# Patient Record
Sex: Female | Born: 2005
Health system: Southern US, Community
[De-identification: ages and names within clinical notes are randomized; demographics above are authoritative.]

## PROBLEM LIST (undated history)

## (undated) DIAGNOSIS — E282 Polycystic ovarian syndrome: Secondary | ICD-10-CM

## (undated) HISTORY — DX: Polycystic ovarian syndrome: E28.2

---
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
## 2023-03-02 ENCOUNTER — Emergency Department (HOSPITAL_COMMUNITY)
Admission: EM | Admit: 2023-03-02 | Discharge: 2023-03-02 | Disposition: A | Discharge status: Home / Self Care | Payer: Medicaid Other | Attending: Emergency Medicine | Admitting: Emergency Medicine

## 2023-03-02 ENCOUNTER — Encounter (HOSPITAL_COMMUNITY): Payer: Self-pay

## 2023-03-02 ENCOUNTER — Other Ambulatory Visit: Payer: Self-pay

## 2023-03-02 DIAGNOSIS — R42 Dizziness and giddiness: Secondary | ICD-10-CM | POA: Diagnosis not present

## 2023-03-02 DIAGNOSIS — E876 Hypokalemia: Secondary | ICD-10-CM

## 2023-03-02 DIAGNOSIS — R55 Syncope and collapse: Secondary | ICD-10-CM | POA: Diagnosis not present

## 2023-03-02 DIAGNOSIS — R000 Tachycardia, unspecified: Secondary | ICD-10-CM | POA: Diagnosis not present

## 2023-03-02 DIAGNOSIS — W01198A Fall on same level from slipping, tripping and stumbling with subsequent striking against other object, initial encounter: Secondary | ICD-10-CM | POA: Insufficient documentation

## 2023-03-02 DIAGNOSIS — W19XXXA Unspecified fall, initial encounter: Secondary | ICD-10-CM | POA: Diagnosis not present

## 2023-03-02 LAB — CBC
HCT: 37 % (ref 36.0–46.0)
Hemoglobin: 12.1 g/dL (ref 12.0–15.0)
MCH: 30.3 pg (ref 26.0–34.0)
MCHC: 32.7 g/dL (ref 30.0–36.0)
MCV: 92.7 fL (ref 80.0–100.0)
Platelets: 197 10*3/uL (ref 150–400)
RBC: 3.99 MIL/uL (ref 3.87–5.11)
RDW: 11.9 % (ref 11.5–15.5)
WBC: 8.2 10*3/uL (ref 4.0–10.5)
nRBC: 0 % (ref 0.0–0.2)

## 2023-03-02 LAB — BASIC METABOLIC PANEL
Anion gap: 7 (ref 5–15)
BUN: 9 mg/dL (ref 6–20)
CO2: 24 mmol/L (ref 22–32)
Calcium: 9.4 mg/dL (ref 8.9–10.3)
Chloride: 102 mmol/L (ref 98–111)
Creatinine, Ser: 0.95 mg/dL (ref 0.44–1.00)
GFR, Estimated: >60 mL/min (ref >60)
Glucose, Bld: 75 mg/dL (ref 70–99)
Potassium: 3.1 mmol/L — ABNORMAL LOW (ref 3.5–5.1)
Sodium: 133 mmol/L — ABNORMAL LOW (ref 135–145)

## 2023-03-02 LAB — URINALYSIS, ROUTINE W REFLEX MICROSCOPIC
Bilirubin Urine: NEGATIVE
Glucose, UA: NEGATIVE mg/dL
Hgb urine dipstick: NEGATIVE
Ketones, ur: 5 mg/dL — AB
Leukocytes,Ua: NEGATIVE
Nitrite: NEGATIVE
Protein, ur: 30 mg/dL — AB
Specific Gravity, Urine: 1.009 (ref 1.005–1.030)
pH: 7 (ref 5.0–8.0)

## 2023-03-02 LAB — CBG MONITORING, ED: Glucose-Capillary: 110 mg/dL — ABNORMAL HIGH (ref 70–99)

## 2023-03-02 LAB — POC URINE PREG, ED: Preg Test, Ur: NEGATIVE

## 2023-03-02 MED ORDER — POTASSIUM CHLORIDE ER 10 MEQ PO TBCR: 10.0000 meq | EXTENDED_RELEASE_TABLET | Freq: Every day | ORAL | 0 refills | Status: DC

## 2023-05-26 DIAGNOSIS — N83201 Unspecified ovarian cyst, right side: Secondary | ICD-10-CM | POA: Diagnosis not present

## 2023-05-26 DIAGNOSIS — O039 Complete or unspecified spontaneous abortion without complication: Secondary | ICD-10-CM | POA: Diagnosis not present

## 2023-05-31 ENCOUNTER — Encounter: Payer: Self-pay | Admitting: Obstetrics & Gynecology

## 2023-05-31 ENCOUNTER — Ambulatory Visit (INDEPENDENT_AMBULATORY_CARE_PROVIDER_SITE_OTHER): Payer: Self-pay | Admitting: Obstetrics & Gynecology

## 2023-05-31 VITALS — BP 105/69 | HR 88 | Ht 64.0 in | Wt 113.6 lb

## 2023-05-31 DIAGNOSIS — Z3A00 Weeks of gestation of pregnancy not specified: Secondary | ICD-10-CM

## 2023-05-31 DIAGNOSIS — Z30013 Encounter for initial prescription of injectable contraceptive: Secondary | ICD-10-CM

## 2023-05-31 DIAGNOSIS — O039 Complete or unspecified spontaneous abortion without complication: Secondary | ICD-10-CM

## 2023-05-31 MED ORDER — MEDROXYPROGESTERONE ACETATE 150 MG/ML IM SUSP: 150.0000 mg | INTRAMUSCULAR | 4 refills | Status: DC

## 2023-05-31 MED ORDER — MEDROXYPROGESTERONE ACETATE 150 MG/ML IM SUSY
150.0000 mg | PREFILLED_SYRINGE | Freq: Once | INTRAMUSCULAR | Status: AC
  Administered 2023-05-31: 150 mg via INTRAMUSCULAR

## 2023-05-31 NOTE — Progress Notes (Signed)
   GYN VISIT Patient name: Kelsey Marsh MRN [id number]  Date of birth: [birth year] Chief Complaint:   Follow-up (Seen at [facility] ER for SAB)  History of Present Illness:   Kelsey Marsh is a 18 y.o. G54P0010 female being seen today for SAB follow up..     Se[facility] ED [facility] due to vaginal bleeding HCG: 2938 US showed thickened endometrium and normal ovaries.   Pt diagnosed with recent miscarriage and advised outpatient follow up.  Today she reports that she is still bleeding, but improved since prior ER visit.  Using about 4 pads per day with passage of only small clots.  Denies significant dysmenorrhea or pelvic pain  Patient was on OCPs and taking regularly.  She reports that the condom broke tried Plan B and still this pregnancy/miscarriage occurred.  She wishes to review contraceptive options  Last seen by Roseanne Reno 05/29/2022 at that time she was on OCPS.  No LMP recorded (lmp unknown).    Review of Systems:   Pertinent items are noted in HPI Denies fever/chills, dizziness, headaches, visual disturbances, fatigue, shortness of breath, chest pain, abdominal pain, vomiting, no problems with periods, bowel movements, urination, or intercourse unless otherwise stated above.  Pertinent History Reviewed:  History reviewed. No pertinent surgical history.  History reviewed. No pertinent past medical history. Reviewed problem list, medications and allergies. Physical Assessment:   Vitals:   05/31/23 1546  BP: 105/69  Pulse: 88  Weight: 113 lb 9.6 oz (51.5 kg)  Height: 5\' 4"  (1.626 m)  Body mass index is 19.5 kg/m.       Physical Examination:   General appearance: alert, well appearing, and in no distress  Psych: mood appropriate, normal affect  Skin: warm & dry   Cardiovascular: normal heart rate noted  Respiratory: normal respiratory effort, no distress  Abdomen: soft, non-tender, no rebound no guarding  Pelvic: examination not indicated  Extremities: no  edema   Chaperone: N/A    Assessment & Plan:  1) SAB -Reassured patient that bleeding may continue and suspect resolution over the next 1 to 2 weeks -Plan to follow HCG level  2) Contraceptive management -Reviewed all contraceptive options -Plan to transition to Depo   Orders Placed This Encounter  Procedures   Beta hCG quant (ref lab)    Return in about 3 months (around 08/29/2023) for Depot shot every 3 mos (RN visit), please send to lab.   Myna Hidalgo, DO Attending Obstetrician & Gynecologist, [facility] for [employer], [facility]

## 2023-06-01 ENCOUNTER — Encounter: Payer: Self-pay | Admitting: Obstetrics & Gynecology

## 2023-06-05 ENCOUNTER — Encounter: Payer: BC Managed Care – PPO | Admitting: Adult Health

## 2023-07-10 DIAGNOSIS — Z23 Encounter for immunization: Secondary | ICD-10-CM | POA: Diagnosis not present

## 2023-08-23 ENCOUNTER — Encounter: Payer: Self-pay | Admitting: Women's Health

## 2023-08-23 ENCOUNTER — Ambulatory Visit: Admitting: Women's Health

## 2023-08-23 ENCOUNTER — Ambulatory Visit: Payer: Self-pay

## 2023-08-23 VITALS — BP 110/74 | HR 81 | Ht 64.0 in | Wt 117.0 lb

## 2023-08-23 DIAGNOSIS — Z3009 Encounter for other general counseling and advice on contraception: Secondary | ICD-10-CM

## 2023-08-23 DIAGNOSIS — Z113 Encounter for screening for infections with a predominantly sexual mode of transmission: Secondary | ICD-10-CM

## 2023-08-23 MED ORDER — MISOPROSTOL 200 MCG PO TABS: 400.0000 ug | ORAL_TABLET | Freq: Once | ORAL | 0 refills | Status: DC

## 2023-08-23 NOTE — Patient Instructions (Signed)
 NO SEX UNTIL AFTER YOU GET YOUR BIRTH CONTROL  Take a pregnancy test the morning of your visit, if negative, take the 2 pills at least 2 hours before your appointment If it is positive, DO NOT take the medicine, and call us

## 2023-08-23 NOTE — Progress Notes (Signed)
   GYN VISIT Patient name: Kelsey Marsh MRN [id number]  Date of birth: 2005/09/30 Chief Complaint:   borth consult consult (Last depo  jan 2025,next shot due today,talk about iud)  History of Present Illness:   Kelsey Marsh is a 18 y.o. G50P0010 female being seen today to discuss getting IUD. Got 1 shot of depo 1/16-due for next shot today, has noticed increased acne, weight gain. Wants IUD. Interested in Paragard, however has irregular periods (sometimes q few months) when not on birth control w/ h/o heavy periods. Discussed other options, wants [name]. Last sex last night. Has had on & off bleeding w/ depo, last bled 4/7 for couple of days.   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   Patient's last menstrual period was 08/20/2023. The current method of family planning is Depo-Provera injections.  Last pap <21yo. Results were: N/A      No data to display               No data to display           Review of Systems:   Pertinent items are noted in HPI Denies fever/chills, dizziness, headaches, visual disturbances, fatigue, shortness of breath, chest pain, abdominal pain, vomiting, abnormal vaginal discharge/itching/odor/irritation, problems with periods, bowel movements, urination, or intercourse unless otherwise stated above.  Pertinent History Reviewed:  Reviewed past medical,surgical, social, obstetrical and family history.  Reviewed problem list, medications and allergies. Physical Assessment:   Vitals:   08/23/23 1051  BP: 110/74  Pulse: 81  Weight: 117 lb (53.1 kg)  Height: 5\' 4"  (1.626 m)  Body mass index is 20.08 kg/m.       Physical Examination:   General appearance: alert, well appearing, and in no distress  Mental status: alert, oriented to person, place, and time  Skin: warm & dry   Cardiovascular: normal heart rate noted  Respiratory: normal respiratory effort, no distress  Abdomen: soft, non-tender   Pelvic: examination not indicated  Extremities:  no edema   Chaperone: N/A  No results found for this or any previous visit (from the past 24 hours).  Assessment & Plan:  1) Contraception counseling> 1 dose of depo 1/16, due for next today, but wants [name] IUD (nullip, sex last night). Abstinence until after IUD. Has clinicals until 4/28. Rx cytotec, take 2hr before IUD appt (as long as pregnancy test neg that am).   2) STD screen> gc/ct today  Meds:  Meds ordered this encounter  Medications   misoprostol (CYTOTEC) 200 MCG tablet    Sig: Take 2 tablets (400 mcg total) by mouth once for 1 dose. At least 2 hours before your IUD appointment    Dispense:  2 tablet    Refill:  0    Orders Placed This Encounter  Procedures   GC/Chlamydia Probe Amp    Return for 4/28 IUD insertion.  Cheral Marker CNM, [facility] 08/23/2023 11:34 AM

## 2023-08-24 DIAGNOSIS — Z113 Encounter for screening for infections with a predominantly sexual mode of transmission: Secondary | ICD-10-CM | POA: Diagnosis not present

## 2023-08-28 LAB — GC/CHLAMYDIA PROBE AMP
Chlamydia trachomatis, NAA: NEGATIVE
Neisseria Gonorrhoeae by PCR: NEGATIVE

## 2023-09-10 ENCOUNTER — Encounter: Payer: Self-pay | Admitting: Women's Health

## 2023-09-10 ENCOUNTER — Ambulatory Visit: Admitting: Women's Health

## 2023-09-10 VITALS — BP 110/71 | HR 73 | Wt 115.3 lb

## 2023-09-10 DIAGNOSIS — Z30430 Encounter for insertion of intrauterine contraceptive device: Secondary | ICD-10-CM | POA: Diagnosis not present

## 2023-09-10 DIAGNOSIS — Z3202 Encounter for pregnancy test, result negative: Secondary | ICD-10-CM | POA: Diagnosis not present

## 2023-09-10 LAB — POCT URINE PREGNANCY: Preg Test, Ur: NEGATIVE

## 2023-09-10 MED ORDER — LEVONORGESTREL 19.5 MG IU IUD: INTRAUTERINE_SYSTEM | Freq: Once | INTRAUTERINE | Status: AC

## 2023-09-10 NOTE — Progress Notes (Signed)
   IUD INSERTION Patient name: Kelsey Marsh MRN [id number]  Date of birth: 20-Mar-2006 Subjective Findings:   Mistica Deichmann Vanmetre is a 18 y.o. G74P0010 female being seen today for insertion of a [name] IUD.  Patient's last menstrual period was 09/06/2023. Last sexual intercourse was 1.5wks ago, but on period now Last pap<21yo. Results were: N/A  The risks and benefits of the method and placement have been thouroughly reviewed with the patient and all questions were answered.  Specifically the patient is aware of failure rate of 05/998, expulsion of the IUD and of possible perforation.  The patient is aware of irregular bleeding due to the method and understands the incidence of irregular bleeding diminishes with time.  Signed copy of informed consent in chart.       No data to display               No data to display           Pertinent History Reviewed:   Reviewed past medical,surgical, social, obstetrical and family history.  Reviewed problem list, medications and allergies. Objective Findings & Procedure:   Vitals:   09/10/23 1438  BP: 110/71  Pulse: 73  Weight: 115 lb 4.8 oz (52.3 kg)  Body mass index is 19.79 kg/m.  Results for orders placed or performed in visit on 09/10/23 (from the past 24 hours)  POCT urine pregnancy   Collection Time: 09/10/23  2:49 PM  Result Value Ref Range   Preg Test, Ur Negative Negative     Time out was performed.  A graves speculum was placed in the vagina.  The cervix was visualized, prepped using Betadine, and grasped with a single tooth tenaculum. The uterus was found to be neutral and it sounded to 7 cm.  [name]  IUD placed per manufacturer's recommendations. The strings were trimmed to approximately 3 cm. The patient tolerated the procedure well.   Informal transabdominal sonogram was performed and the proper placement of the IUD was verified.  Chaperone: Archivist & Plan:   1) [name] IUD insertion The  patient was given post procedure instructions, including signs and symptoms of infection and to check for the strings after each menses or each month, and refraining from intercourse or anything in the vagina for 3 days. She was given a care card with date IUD placed, and date IUD to be removed. She is scheduled for a f/u appointment in 4 weeks.  Orders Placed This Encounter  Procedures   POCT urine pregnancy    Return in about 4 weeks (around 10/08/2023) for IUD f/u, CNM, in person.  Ferd Householder CNM, [facility] 09/10/2023 3:11 PM

## 2023-09-10 NOTE — Addendum Note (Signed)
 Addended by: Laurinda Porch A on: 09/10/2023 04:00 PM   Modules accepted: Orders

## 2023-09-10 NOTE — Patient Instructions (Signed)

## 2023-10-09 ENCOUNTER — Ambulatory Visit (INDEPENDENT_AMBULATORY_CARE_PROVIDER_SITE_OTHER): Admitting: Adult Health

## 2023-10-09 ENCOUNTER — Encounter: Payer: Self-pay | Admitting: Adult Health

## 2023-10-09 VITALS — BP 106/69 | HR 71 | Ht 64.0 in | Wt 121.5 lb

## 2023-10-09 DIAGNOSIS — Z30431 Encounter for routine checking of intrauterine contraceptive device: Secondary | ICD-10-CM | POA: Diagnosis not present

## 2023-10-09 NOTE — Progress Notes (Signed)
  Subjective:     Patient ID: Kelsey Marsh, female   DOB: [birth year], 18 y.o.   MRN: [id number]  HPI Kelsey Marsh is a 18 year old female, single, G1P0010, in for IUD check, had kyleena  placed 09/10/23.    Review of Systems For IUD check Denies any bleeding or pain Reviewed past medical,surgical, social and family history. Reviewed medications and allergies.     Objective:   Physical Exam BP 106/69 (BP Location: Left Arm, Patient Position: Sitting, Cuff Size: Normal)   Pulse 71   Ht 5\' 4"  (1.626 m)   Wt 121 lb 8 oz (55.1 kg)   LMP 10/04/2023 (Approximate)   BMI 20.86 kg/m     Skin warm and dry.Pelvic: external genitalia is normal in appearance no lesions, vagina: pink,urethra has no lesions or masses noted, cervix:smooth, strings are short, but seen at os, uterus: normal size, shape and contour, non tender, no masses felt, adnexa: no masses or tenderness noted. Bladder is non tender and no masses felt.  Fall risk is low  Upstream - 10/09/23 1059       Pregnancy Intention Screening   Does the patient want to become pregnant in the next year? No    Does the patient's partner want to become pregnant in the next year? No    Would the patient like to discuss contraceptive options today? No      Contraception Wrap Up   Current Method IUD or IUS    End Method IUD or IUS    Contraception Counseling Provided Yes            Examination chaperoned by Alphonso Aschoff LPN  Assessment:     1. IUD check up (Primary) [name]  placed 09/10/23 Strings short, but seen at os    Plan:     Follow up prn Pap at 19

## 2024-02-13 ENCOUNTER — Encounter (INDEPENDENT_AMBULATORY_CARE_PROVIDER_SITE_OTHER): Payer: Self-pay

## 2024-02-14 ENCOUNTER — Encounter (INDEPENDENT_AMBULATORY_CARE_PROVIDER_SITE_OTHER): Payer: Self-pay

## 2024-03-25 ENCOUNTER — Ambulatory Visit (INDEPENDENT_AMBULATORY_CARE_PROVIDER_SITE_OTHER): Admitting: Adult Health

## 2024-03-25 ENCOUNTER — Encounter: Payer: Self-pay | Admitting: Adult Health

## 2024-03-25 ENCOUNTER — Other Ambulatory Visit (HOSPITAL_COMMUNITY)
Admission: RE | Admit: 2024-03-25 | Discharge: 2024-03-25 | Disposition: A | Discharge status: Home / Self Care | Source: Ambulatory Visit | Attending: Adult Health | Admitting: Adult Health

## 2024-03-25 VITALS — BP 117/77 | HR 76 | Ht 64.0 in | Wt 118.0 lb

## 2024-03-25 DIAGNOSIS — R1031 Right lower quadrant pain: Secondary | ICD-10-CM | POA: Diagnosis not present

## 2024-03-25 DIAGNOSIS — Z975 Presence of (intrauterine) contraceptive device: Secondary | ICD-10-CM

## 2024-03-25 DIAGNOSIS — Z113 Encounter for screening for infections with a predominantly sexual mode of transmission: Secondary | ICD-10-CM | POA: Diagnosis not present

## 2024-03-25 NOTE — Progress Notes (Signed)
  Subjective:     Patient ID: Kelsey Marsh, female   DOB: 2005-07-07, 18 y.o.   MRN: [id number]  HPI Kelsey Marsh is a 18 year old female, single, G1P0010, in complaining of RLQ pain since fist of this year, had sharp pain today and some nausea. She has IUD. She works for [employer], as CMA.   Review of Systems +RLQ pain since fist of this year, had sharp pain today and some nausea.  Reviewed past medical,surgical, social and family history. Reviewed medications and allergies.      Objective:   Physical Exam BP 117/77 (BP Location: Right Arm, Patient Position: Sitting, Cuff Size: Normal)   Pulse 76   Ht 5' 4 (1.626 m)   Wt 118 lb (53.5 kg)   BMI 20.25 kg/m     Skin warm and dry.Pelvic: external genitalia is normal in appearance no lesions, vagina: white discharge without odor,urethra has no lesions or masses noted, cervix:smooth, +short IUD strings at os, uterus: normal size, shape and contour, mildly tender, no masses felt, adnexa: no masses or tenderness noted. Bladder is non tender and no masses felt. CV swab obtained  Fall risk is low  Upstream - 03/25/24 1513       Pregnancy Intention Screening   Does the patient want to become pregnant in the next year? No    Does the patient's partner want to become pregnant in the next year? No    Would the patient like to discuss contraceptive options today? No      Contraception Wrap Up   Current Method IUD or IUS    End Method IUD or IUS    Contraception Counseling Provided No         Examination chaperoned by Tish RN  Assessment:     1. RLQ abdominal pain (Primary) RLQ pain since fist of this year, had sharp pain today and some nausea.  Will get pelvic US  in office in 1 weeks to assess uterus and ovaries   - US  PELVIC COMPLETE WITH TRANSVAGINAL; Future  2. IUD (intrauterine device) in place [name]  placed 09/10/23 - US  PELVIC COMPLETE WITH TRANSVAGINAL; Future  3. Screening for STDs (sexually transmitted diseases) CV  swab sent for GC/CHL    Plan:      Return on 1 weeks for pelvic US

## 2024-03-27 ENCOUNTER — Ambulatory Visit: Payer: Self-pay | Admitting: Adult Health

## 2024-03-27 LAB — CERVICOVAGINAL ANCILLARY ONLY
Chlamydia: NEGATIVE
Comment: NEGATIVE
Comment: NORMAL
Neisseria Gonorrhea: NEGATIVE

## 2024-04-01 ENCOUNTER — Encounter: Payer: Self-pay | Admitting: Adult Health

## 2024-04-01 ENCOUNTER — Ambulatory Visit: Admitting: Radiology

## 2024-04-01 DIAGNOSIS — R1031 Right lower quadrant pain: Secondary | ICD-10-CM

## 2024-04-01 DIAGNOSIS — Z975 Presence of (intrauterine) contraceptive device: Secondary | ICD-10-CM | POA: Diagnosis not present

## 2024-04-01 NOTE — Progress Notes (Signed)
 GYN US : TA and TV imaging performed - vinyl probe cover used - Chaperone: Emma Anteverted uterus normal in size, symmetrical, homogeneous myometrium, no focal abn seen,  IUD in normal position within mid upper cavity, arms out. Endom thickness =  3.4 mm, uniform avascular cavity and canal, no evidence of intracavitary defects.   Ovaries appear upper limits of normal in size with numerous small follicles peripherally placed c/w PCOS pattern.  The ovaries appear mobile, neg adnexal regions,  neg CDS, no free fluid present

## 2024-06-02 ENCOUNTER — Encounter: Payer: Self-pay | Admitting: Adult Health

## 2024-06-02 ENCOUNTER — Ambulatory Visit: Admitting: Adult Health

## 2024-06-02 VITALS — BP 131/79 | HR 98 | Ht 64.0 in | Wt 114.5 lb

## 2024-06-02 DIAGNOSIS — Z975 Presence of (intrauterine) contraceptive device: Secondary | ICD-10-CM

## 2024-06-02 DIAGNOSIS — R1031 Right lower quadrant pain: Secondary | ICD-10-CM | POA: Diagnosis not present

## 2024-06-02 DIAGNOSIS — Z3202 Encounter for pregnancy test, result negative: Secondary | ICD-10-CM | POA: Diagnosis not present

## 2024-06-02 LAB — POCT URINE PREGNANCY: Preg Test, Ur: NEGATIVE

## 2024-06-02 MED ORDER — ONDANSETRON 4 MG PO TBDP: 4.0000 mg | ORAL_TABLET | Freq: Three times a day (TID) | ORAL | 1 refills | Status: AC | PRN

## 2024-06-02 NOTE — Progress Notes (Signed)
" °  Subjective:     Patient ID: Kelsey Marsh, female   DOB: 2005/12/26, 19 y.o.   MRN: [id number]  HPI  Kelsey Marsh is a 19 year old female, single, G1P0010 in complaining of RLQ pain few days ago, wonders  if cyst ruptured, had nausea, too. She wanted to know if way to stop the cysts from rupturing. And she requests UPT. Has kyleena  IUD.  Review of Systems Had RLQ pain few days ago and nausea, wonders if cyst ruptured, has IUD Reviewed past medical,surgical, social and family history. Reviewed medications and allergies.     Objective:   Physical Exam BP 131/79 (BP Location: Left Arm, Patient Position: Sitting, Cuff Size: Normal)   Pulse 98   Ht 5' 4 (1.626 m)   Wt 114 lb 8 oz (51.9 kg)   LMP 05/02/2024 (Approximate)   BMI 19.65 kg/m  UPT negative    Skin warm and dry.  Lungs: clear to ausculation bilaterally. Cardiovascular: regular rate and rhythm.  Fall risk is low  Upstream - 06/02/24 1105       Pregnancy Intention Screening   Does the patient want to become pregnant in the next year? No    Does the patient's partner want to become pregnant in the next year? No    Would the patient like to discuss contraceptive options today? No      Contraception Wrap Up   Current Method IUD or IUS    End Method IUD or IUS    Contraception Counseling Provided Yes          Assessment:     1. Negative pregnancy test - POCT urine pregnancy  2. RLQ abdominal pain (Primary) Has pain few days ago, none now Will get US  to assess uterus and ovaries Can take 3 advil and 2 ES tylenol for pain  Will rx zofran  for nausea Meds ordered this encounter  Medications   ondansetron  (ZOFRAN -ODT) 4 MG disintegrating tablet    Sig: Take 1 tablet (4 mg total) by mouth every 8 (eight) hours as needed for nausea or vomiting.    Dispense:  30 tablet    Refill:  1    Supervising Provider:   JAYNE MINDER H [[id number]]    - US  PELVIC COMPLETE WITH TRANSVAGINAL; Future     Plan:     Return in 4  weeks for pelvic US  in office     "

## 2024-06-18 ENCOUNTER — Ambulatory Visit (HOSPITAL_COMMUNITY): Admission: RE | Admit: 2024-06-18 | Discharge: 2024-06-18 | Disposition: A | Source: Ambulatory Visit

## 2024-06-18 ENCOUNTER — Other Ambulatory Visit (HOSPITAL_COMMUNITY): Payer: Self-pay

## 2024-06-18 DIAGNOSIS — R310 Gross hematuria: Secondary | ICD-10-CM

## 2024-06-30 ENCOUNTER — Other Ambulatory Visit: Admitting: Radiology
